# Patient Record
Sex: Male | Born: 1965 | Race: White | Hispanic: No | Marital: Single | Smoking: Never smoker
Health system: Southern US, Community
[De-identification: ages and names within clinical notes are randomized; demographics above are authoritative.]

## PROBLEM LIST (undated history)

## (undated) DIAGNOSIS — R519 Headache, unspecified: Secondary | ICD-10-CM

## (undated) DIAGNOSIS — H539 Unspecified visual disturbance: Secondary | ICD-10-CM

## (undated) DIAGNOSIS — K589 Irritable bowel syndrome without diarrhea: Secondary | ICD-10-CM

## (undated) DIAGNOSIS — M545 Low back pain, unspecified: Secondary | ICD-10-CM

## (undated) DIAGNOSIS — R110 Nausea: Secondary | ICD-10-CM

## (undated) DIAGNOSIS — K219 Gastro-esophageal reflux disease without esophagitis: Secondary | ICD-10-CM

## (undated) HISTORY — DX: Nausea: R11.0

## (undated) HISTORY — DX: Headache, unspecified: R51.9

## (undated) HISTORY — DX: Irritable bowel syndrome without diarrhea: K58.9

## (undated) HISTORY — DX: Low back pain, unspecified: M54.50

## (undated) HISTORY — DX: Unspecified visual disturbance: H53.9

## (undated) HISTORY — DX: Gastro-esophageal reflux disease without esophagitis: K21.9

---

## 2016-11-19 NOTE — Pre-Procedure Instructions (Signed)
Instructions on Hibiclens and medication emailed to PT at [email or web address]

## 2016-11-22 ENCOUNTER — Ambulatory Visit: Payer: BLUE CROSS/BLUE SHIELD | Admitting: Critical Care Medicine

## 2016-11-22 ENCOUNTER — Ambulatory Visit
Admission: RE | Admit: 2016-11-22 | Discharge: 2016-11-22 | Disposition: A | Discharge status: Home / Self Care | Payer: BLUE CROSS/BLUE SHIELD | Source: Ambulatory Visit | Attending: Surgery | Admitting: Surgery

## 2016-11-22 ENCOUNTER — Ambulatory Visit: Payer: BLUE CROSS/BLUE SHIELD | Admitting: Surgery

## 2016-11-22 ENCOUNTER — Encounter
Admission: RE | Disposition: A | Discharge status: Home / Self Care | Payer: Self-pay | Source: Ambulatory Visit | Attending: Surgery

## 2016-11-22 ENCOUNTER — Ambulatory Visit: Payer: Self-pay

## 2016-11-22 DIAGNOSIS — R2232 Localized swelling, mass and lump, left upper limb: Secondary | ICD-10-CM

## 2016-11-22 DIAGNOSIS — D1779 Benign lipomatous neoplasm of other sites: Secondary | ICD-10-CM | POA: Insufficient documentation

## 2016-11-22 SURGERY — EXCISION, LESION
Anesthesia: Anesthesia General | Site: Arm Upper | Laterality: Left | Wound class: Clean

## 2016-11-22 MED ORDER — METOCLOPRAMIDE HCL 5 MG/ML IJ SOLN
10.0000 mg | Freq: Once | INTRAMUSCULAR | Status: DC | PRN
Start: 2016-11-22 — End: 2016-11-22

## 2016-11-22 MED ORDER — BUPIVACAINE HCL (PF) 0.5 % IJ SOLN
INTRAMUSCULAR | Status: AC
Start: 2016-11-22 — End: ?
  Filled 2016-11-22: qty 30

## 2016-11-22 MED ORDER — LACTATED RINGERS IV SOLN
INTRAVENOUS | Status: DC | PRN
Start: 2016-11-22 — End: 2016-11-22

## 2016-11-22 MED ORDER — ACETAMINOPHEN 325 MG PO TABS
650.0000 mg | ORAL_TABLET | Freq: Once | ORAL | Status: DC | PRN
Start: 2016-11-22 — End: 2016-11-22

## 2016-11-22 MED ORDER — PROPOFOL 10 MG/ML IV EMUL (WRAP)
INTRAVENOUS | Status: AC
Start: 2016-11-22 — End: ?
  Filled 2016-11-22: qty 20

## 2016-11-22 MED ORDER — FENTANYL CITRATE (PF) 50 MCG/ML IJ SOLN (WRAP)
INTRAMUSCULAR | Status: DC | PRN
Start: 2016-11-22 — End: 2016-11-22
  Administered 2016-11-22 (×2): 25 ug via INTRAVENOUS

## 2016-11-22 MED ORDER — LIDOCAINE HCL (PF) 2 % IJ SOLN
INTRAMUSCULAR | Status: AC
Start: 2016-11-22 — End: ?
  Filled 2016-11-22: qty 5

## 2016-11-22 MED ORDER — MIDAZOLAM HCL 2 MG/2ML IJ SOLN
INTRAMUSCULAR | Status: DC | PRN
Start: 2016-11-22 — End: 2016-11-22
  Administered 2016-11-22: 2 mg via INTRAVENOUS

## 2016-11-22 MED ORDER — CEFAZOLIN SODIUM 1 G IJ SOLR
INTRAMUSCULAR | Status: AC
Start: 2016-11-22 — End: ?
  Filled 2016-11-22: qty 2000

## 2016-11-22 MED ORDER — HYDROMORPHONE HCL 2 MG PO TABS
2.0000 mg | ORAL_TABLET | Freq: Once | ORAL | Status: DC | PRN
Start: 2016-11-22 — End: 2016-11-22

## 2016-11-22 MED ORDER — FENTANYL CITRATE (PF) 50 MCG/ML IJ SOLN (WRAP)
25.0000 ug | INTRAMUSCULAR | Status: DC | PRN
Start: 2016-11-22 — End: 2016-11-22

## 2016-11-22 MED ORDER — LIDOCAINE HCL 2 % IJ SOLN
INTRAMUSCULAR | Status: DC | PRN
Start: 2016-11-22 — End: 2016-11-22
  Administered 2016-11-22: 100 mg

## 2016-11-22 MED ORDER — ACETAMINOPHEN 325 MG PO TABS
650.0000 mg | ORAL_TABLET | Freq: Three times a day (TID) | ORAL | 0 refills | Status: AC
Start: 2016-11-22 — End: 2016-11-27

## 2016-11-22 MED ORDER — FENTANYL CITRATE (PF) 50 MCG/ML IJ SOLN (WRAP)
INTRAMUSCULAR | Status: AC
Start: 2016-11-22 — End: ?
  Filled 2016-11-22: qty 2

## 2016-11-22 MED ORDER — PROPOFOL INFUSION 10 MG/ML
INTRAVENOUS | Status: DC | PRN
Start: 2016-11-22 — End: 2016-11-22
  Administered 2016-11-22: 200 mg via INTRAVENOUS

## 2016-11-22 MED ORDER — LIDOCAINE HCL 1 % IJ SOLN
INTRAMUSCULAR | Status: AC
Start: 2016-11-22 — End: ?
  Filled 2016-11-22: qty 20

## 2016-11-22 MED ORDER — MIDAZOLAM HCL 2 MG/2ML IJ SOLN
INTRAMUSCULAR | Status: AC
Start: 2016-11-22 — End: ?
  Filled 2016-11-22: qty 2

## 2016-11-22 MED ORDER — CEFAZOLIN SODIUM 1 G IJ SOLR
2.0000 g | INTRAMUSCULAR | Status: AC
Start: 2016-11-22 — End: 2016-11-22
  Administered 2016-11-22: 10:00:00 2 g via INTRAVENOUS

## 2016-11-22 MED ORDER — BUPIVACAINE-EPINEPHRINE (PF) 0.5% -1:200000 IJ SOLN
INTRAMUSCULAR | Status: DC | PRN
Start: 2016-11-22 — End: 2016-11-22
  Administered 2016-11-22: 30 mL

## 2016-11-22 MED ORDER — ONDANSETRON HCL 4 MG/2ML IJ SOLN
INTRAMUSCULAR | Status: DC | PRN
Start: 2016-11-22 — End: 2016-11-22
  Administered 2016-11-22: 4 mg via INTRAVENOUS

## 2016-11-22 MED ORDER — ONDANSETRON HCL 4 MG/2ML IJ SOLN
INTRAMUSCULAR | Status: AC
Start: 2016-11-22 — End: ?
  Filled 2016-11-22: qty 2

## 2016-11-22 MED ORDER — ONDANSETRON HCL 4 MG/2ML IJ SOLN
4.0000 mg | Freq: Once | INTRAMUSCULAR | Status: DC | PRN
Start: 2016-11-22 — End: 2016-11-22

## 2016-11-22 MED ORDER — HYDROMORPHONE HCL 1 MG/ML IJ SOLN
0.5000 mg | INTRAMUSCULAR | Status: DC | PRN
Start: 2016-11-22 — End: 2016-11-22

## 2016-11-22 MED ORDER — CEFAZOLIN SODIUM 1 G IJ SOLR
2.0000 g | Freq: Once | INTRAMUSCULAR | Status: DC
Start: 2016-11-22 — End: 2016-11-22

## 2016-11-22 MED ORDER — BUPIVACAINE-EPINEPHRINE (PF) 0.5% -1:200000 IJ SOLN
INTRAMUSCULAR | Status: AC
Start: 2016-11-22 — End: ?
  Filled 2016-11-22: qty 30

## 2016-11-22 SURGICAL SUPPLY — 46 items
BLADE SS SURGICAL 15 (Blade) ×4 IMPLANT
DRAPE SRG TBRN CNVRT 121.5X106X77IN LF (Drape)
DRAPE SURGICAL ABDOMINAL POLY (Drape)
DRAPE SURGICAL ABDOMINAL POLY LAPAROSCOPIC (Drape) IMPLANT
DRAPE SURGICAL REINFORCE FENESTRATE L121.5 IN X W106 IN X H77 IN (Drape) IMPLANT
DRAPE THYROID WITH ARMBOARD (Drape)
DRAPE TIBURON ABD LAP 14X11IN (Drape)
DRESSING PETRO 3% BI 3BRM GZE XR 9X5IN (Dressing) ×1
DRESSING PETROLATUM XEROFORM L9 IN X W5 IN 3% BISMUTH TRIBROMOPHENATE (Dressing) IMPLANT
DRESSING SECUREMENT TEGADERM L4 1/2 IN X (Dressing)
DRESSING SECUREMENT TEGADERM L4 1/2 IN X W3 1/2 IN INTRAVENOUS FILM (Dressing) IMPLANT
DRESSING TEGADERM 3.5X4.5IN (Dressing)
DRESSING TEGADERM 4X4X3/4IN (Dressing) ×1
DRESSING TRANSPARENT L4 3/4 IN X W4 IN (Dressing) ×1
DRESSING TRANSPARENT L4 3/4 IN X W4 IN POLYURETHANE ADHESIVE (Dressing) IMPLANT
DRESSING XEROFORM 5X9IN (Dressing) ×1
ELECTRODE ELECTROSURGICAL BLADE PENCIL L10 FT OD3/8 IN PLUMEPEN ELITE (Cautery) IMPLANT
ELECTRODE ESURG BLDE PNCL PLUMEPEN ELT (Cautery) ×1
GAUZE SPONGE 4X4 NS (Dressing) ×1
GLOVE SURG BIOGEL SZ7.5 (Glove) ×4 IMPLANT
GOWN SMART IMPERVIOUS LARGE (Gown) ×6 IMPLANT
MASTISOL VIAL 2/3CC STRL (Skin Closure) ×1 IMPLANT
PACK MINOR (Pack) ×2 IMPLANT
PENCIL PLUMEPEN ELITE 10FT (Cautery) ×1
SPONGE CHLRPRP TINT 26ML (Applicator) ×2 IMPLANT
SPONGE GAUZE L4 IN X W4 IN 16 PLY (Dressing) ×1
SPONGE GAUZE L4 IN X W4 IN 16 PLY MAXIMUM ABSORBENT USP TYPE VII (Dressing) IMPLANT
SUTURE COATED VICRYL 3-0 L18 IN BRAID (Suture)
SUTURE COATED VICRYL 3-0 L18 IN BRAID TIES 12 STRAND COATED UNDYED (Suture) IMPLANT
SUTURE COATED VICRYL 3-0 SH L27 IN BRAID (Suture) ×1
SUTURE COATED VICRYL 3-0 SH L27 IN BRAID COATED VIOLET ABSORBABLE (Suture) ×1 IMPLANT
SUTURE MONOCRYL 4-0 PS2 27IN (Suture) ×2 IMPLANT
SUTURE VICRYL 3-0 12X18IN (Suture)
SUTURE VICRYL 3-0 SH 27IN (Suture) ×1
SYRINGE IRR BULB 60ML (Syringes, Needles) ×1
SYRINGE MEDLINE 60 ML LID SOFT BULB TIP (Syringes, Needles) ×1
SYRINGE MEDLINE 60 ML LID SOFT BULB TIP IRRIGATION TYVEK (Syringes, Needles) ×1 IMPLANT
TIP SUCTION MEDLINE STANDARD PLASTIC (Suction) ×1
TIP SUCTION STANDARD PLASTIC TUBE FLEXIBLE TRANSPARENT SLIP RESISTANT (Suction) ×1 IMPLANT
TOWEL L26 IN X W17 IN COTTON PREWASH (Procedure Accessories) ×1
TOWEL L26 IN X W17 IN COTTON PREWASH DELINT BLUE ACTISORB SURGICAL (Procedure Accessories) ×1 IMPLANT
TOWEL OR STRL BLUE 17X26IN (Procedure Accessories) ×1
TUBE SUCT 7MMX12 STERILE (Suction) ×1
TUBING SUCTION ID9/32 IN L12 FT (Suction) ×1
TUBING SUCTION ID9/32 IN L12 FT NONCONDUCTIVE MALE TO MALE CONNECTOR (Suction) ×1 IMPLANT
YANKAUER BULB TIP W/O VNT FLX (Suction) ×1

## 2016-11-22 NOTE — Progress Notes (Signed)
Pt in PACU arousable now, VSS, still unable to assess pain and will continue assess. Dressing to left posterior armpit/shoulder area CDI, ice to site, IV patent and will continue to assess pt.

## 2016-11-22 NOTE — Discharge Instr - AVS First Page (Signed)
Admit Date: 11/22/2016   Discharge Date: 11/22/2016     Attending: Cordelia Pen, MD   Surgeon: Surgeon(s):  Cordelia Pen, MD  Thomasena Edis, MD      Procedures:  Procedure(s) (LRB):  EXCISION, LESION BENIGN LEFT ARM (Left)      DISCHARGE INSTRUCTIONS--SKIN/SUBCUTANEOUS TISSUE SURGERY    Patient Instructions after your outpatient surgery.  Please refer to the complete postop instructions for details.    WHAT TO EXPECT AT YOUR SURGICAL SITE(S):     Pink/reddish drainage, bruising, swelling/lumps at incision(s) may occur and is normal.    CARE FOR THE INCISION(S):     Skin glue over your incision(s) will dissolve over the next two weeks.   If you have white tapes on the skin (steri-strips), these will fall off over 7-10 days   Shower 24 hours after your surgery.  No tub bathing, or pool/ocean for 1 week.   Use an ice pack every 15-20 min intermittently for the first 48 hrs as needed.   If you have a surgical drain, refer to the attached drain care instructions or to our website.    DIET:     Keep up with your liquids.   If your appetite has returned, eat what your system can tolerate.     ACTIVITY:     If it hurts, don't do it!   Walking & stairs are encouraged.     Avoid strenuous physical activity & lifting until cleared by your surgeon.   You may drive again once you have stopped taking prescription medication for pain.      MEDICATION:     Resume all home medications.   Use over-the-counter pain medications or a prescribed narcotic for your discomfort as needed.     For constipation, an over-the-counter stool softener or laxative may be taken.  Please refer to attached detailed instructions or our website for this issue.    WHAT TO LOOK FOR:    Please call our office immediately at [phone] or go to the ER if you develop any of the following:     Excessive drainage, bleeding, redness or swelling at or around the incision(s)   Fever over 101F   Increased incisional pain   Persistent  nausea or vomiting   Difficulty with urination   Difficulty breathing, chest pain or calf pain    FOLLOW-UP:     We will see you in our office 1-2 weeks after your surgery.   If not already scheduled, please call [phone] to arrange your post op visit.

## 2016-11-22 NOTE — Progress Notes (Signed)
Pt in phase 2 a/a/ox4, VSS, minimal pain upon movement,  Dressing CDI, IV removed and instructions given to both Pt and girlfriend Elon Jester with verbalized understanding, all questions answered, sent home with instructions, belongings and dressings.  Wheeled to car with assistance and discharged home in stable condition.

## 2016-11-22 NOTE — Discharge Instructions (Signed)

## 2016-11-22 NOTE — Anesthesia Postprocedure Evaluation (Signed)
Anesthesia Post Evaluation    Patient: Louis Carter    Procedures performed: Procedure(s):  EXCISION, LESION BENIGN LEFT ARM    Anesthesia type: General ETT    Patient location:PACU    Last vitals:   Vitals:    11/22/16 1115   BP: 118/70   Pulse: 68   Resp: 16   Temp:    SpO2: 100%       Post pain: Patient not complaining of pain, continue current therapy      Mental Status:awake    Respiratory Function: tolerating room air    Cardiovascular: stable    Nausea/Vomiting: patient not complaining of nausea or vomiting    Hydration Status: adequate    Post assessment: no apparent anesthetic complications    Signed by: Harriet Butte, 11/22/2016 1:24 PM

## 2016-11-22 NOTE — Interval H&P Note (Signed)
I have reviewed the H&P, examined the patient and there are no changes.    The risks and benefits of the procedures and alternatives were explained to the patient and they agreed. Patient was given an opportunity to ask questions and all questions were answered to their satisfaction. Informed consent was given for the procedure.    Milbern Doescher, MD  [facility]  Office: [phone]

## 2016-11-22 NOTE — Anesthesia Preprocedure Evaluation (Signed)
Anesthesia Evaluation    AIRWAY    Mallampati: II    TM distance: >3 FB  Neck ROM: full  Mouth Opening:full   CARDIOVASCULAR    cardiovascular exam normal       DENTAL    no notable dental hx     PULMONARY    pulmonary exam normal     OTHER FINDINGS              Relevant Problems   No active problems are marked relevant to this note.               Anesthesia Plan    ASA 2     general                     intravenous induction   Detailed anesthesia plan: general LMA        Post op pain management: per surgeon and PO analgesics    informed consent obtained      pertinent labs reviewed             Signed by: Lamonte Richer Meridith Romick 11/22/16 8:09 AM

## 2016-11-22 NOTE — Op Note (Signed)
OPERATIVE NOTE    Date Time: 11/22/16 10:15 AM  Date of Operation: 11/22/2016    Providers Performing:   Surgeon(s) and Role:     * Cordelia Pen, MD - Primary     * Celedonio Sortino, Dillon Bjork, MD - Resident - Assisting    Surgeon(s):  Cordelia Pen, MD  Thomasena Edis, MD      Operative Procedure:   Procedure(s):  EXCISION, LESION BENIGN LEFT ARM    Indication:   Pre-Op Diagnosis Codes:     * Localized swelling, mass and lump, left upper limb [R22.32]    Details of Operation:   After informed consent was obtained, anesthesia was induced and the patient was  prepped and drapped in sterile fashion. A 4cm incision was made with a #15 blade and taken down to the lipoma capsule using electrocautery. A mass consistent with lipomatous tissue was extruded with pressure as well as blunt dissection and electrocautery. The mass was removed in its entirety and the wound was irrigated and injected with 0.5 % marcaine. The wound was closed with 2-0 vicryl interrupted sutures and the skin was closed with 2-0 nylon vertical mattress stitches. The patient tolerated the procedure well and was transferred to PACU in stable condition    Fluids (I/O):   EBL: minimal  UOP: NR  Crystalloid:  Colloid: none  Blood Products: none    Implants:   * No implants in log *    Drains:   none    Specimens:        SPECIMENS (last 24 hours)      Pathology Specimens     Row Name 11/22/16 0900                Specimen Information    Specimen Testing Required Routine Pathology       Specimen ID  [id number]       Specimen Description Left upper arm lipoma             Wound Class:   Clean    Signed by:  Elfredia Nevins, MD   PGY-4, General Surgery

## 2016-11-22 NOTE — Transfer of Care (Signed)
Anesthesia Transfer of Care Note    Patient: Louis Carter    Procedures performed: Procedure(s):  EXCISION, LESION BENIGN LEFT ARM    Anesthesia type: General LMA    Patient location:Phase I PACU    Last vitals:   Vitals:    11/22/16 1023   BP: 114/59   Pulse: 77   Resp: 16   Temp: 36.9 C (98.5 F)   SpO2: 99%       Post pain: Patient not complaining of pain, continue current therapy      Mental Status:awake    Respiratory Function: tolerating nasal cannula    Cardiovascular: stable    Nausea/Vomiting: patient not complaining of nausea or vomiting    Hydration Status: adequate    Post assessment: no apparent anesthetic complications    Signed by: Lorelee New  11/22/16 10:24 AM

## 2016-11-24 LAB — LAB USE ONLY - HISTORICAL SURGICAL PATHOLOGY

## 2016-11-26 ENCOUNTER — Encounter: Payer: Self-pay | Admitting: Surgery

## 2018-10-25 ENCOUNTER — Other Ambulatory Visit: Payer: Self-pay | Admitting: Surgical

## 2018-10-27 ENCOUNTER — Other Ambulatory Visit: Payer: Self-pay | Admitting: Surgical

## 2022-12-08 IMAGING — MR MRI LUMBAR SPINE WITHOUT CONTRAST
6 of 8 series · 14 of 48 positions shown · IV contrast (gadolinium)
Comparison: None

________________________________________________________________________________________________ 
MRI LUMBAR SPINE WITHOUT CONTRAST, 12/08/2022 [DATE]: 
CLINICAL INDICATION: Radiculopathy, low back pain extending to left buttock and 
leg
TECHNIQUE: Sagittal T1, Sagittal T2, Sagittal STIR, Axial T1 and Axial T2 MR 
images of the lumbar spine were performed without intravenous gadolinium 
enhancement. 
FINDINGS lumbar vertebral heights are intact. There is moderate to marked disc 
narrowing at L5-S1. Other disc heights are preserved. Modic type I change at 
L5-S1. No evidence for malignancy. Conus terminates T12-L1. 
At L5-S1 there is a large left paracentral disc extrusion measuring 10 x 9 mm, 
deforming the left ventral thecal sac and impinging the left S1 nerve root. 
There has been left laminotomy and ligament resection at this level. Foramina 
are open. Moderate facet degenerative change. 
At L4-5 there is mild disc bulge. There is left posterolateral annular tear and 
mild disc bulge, with mild left foraminal encroachment. There is mild 
encroachment on the L5 nerve roots bilaterally as they enter the lateral 
recesses. Moderate facet change. Right foramen open. 
At L3-4 the canal and foramina are open. 
At L2-3 there is left posterolateral disc bulge with mild effacement of the left 
ventral thecal sac as the left L3 nerve root exits. The canal remains open. 
Foramina are open. 
At L1-2 the canal and foramina are open. 
There is mild thoracolumbar dextro curvature.

[Series 101: survey · axial · 10.0mm · 1.25mm/px · 1 of 10 slices shown]
[im 1/10]
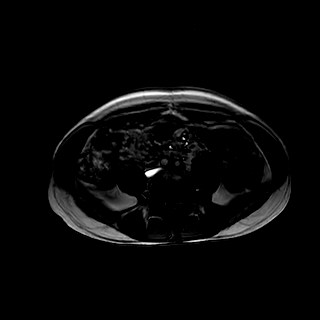

[Series 201: t2w_cor-surv · coronal · 6.0mm · 0.62mm/px · 1 of 10 slices shown]
[im 1/10]
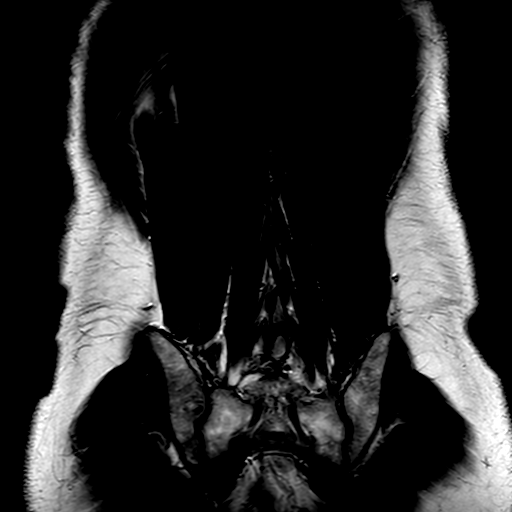

[Series 301: T1 · sagittal · 4.0mm · 0.42mm/px · 3 of 18 slices shown]
[im 1/18]
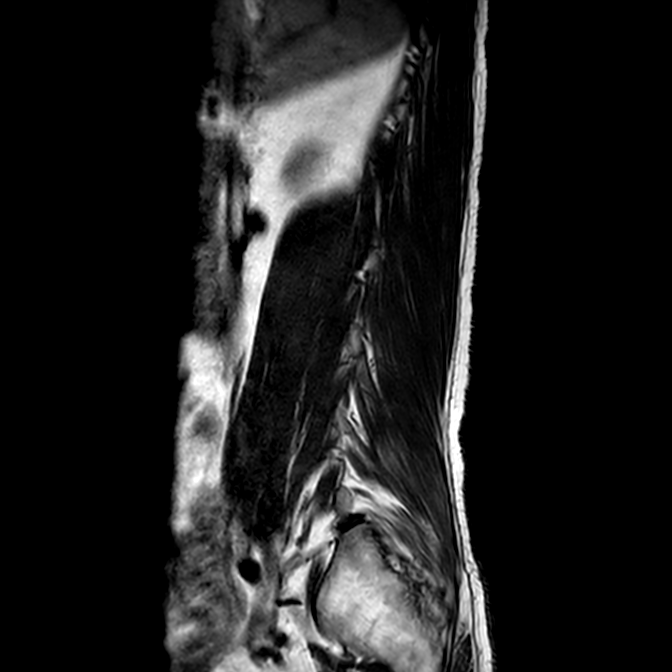
[im 9/18]
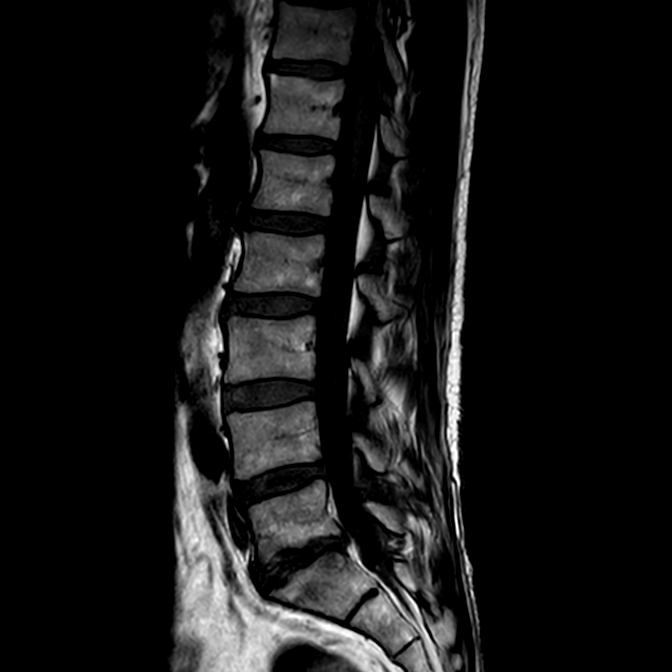
[im 18/18]
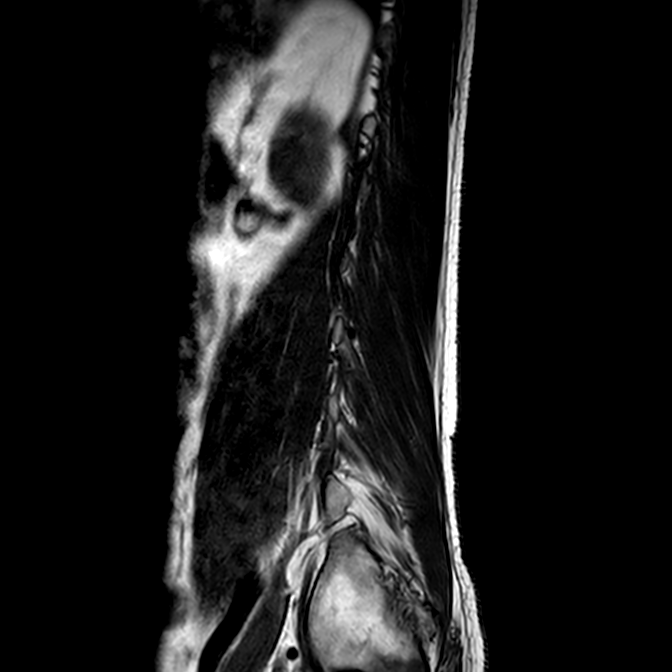

[Series 402: (id)_mdixon_tse · sagittal · 4.0mm · 0.42mm/px · 3 of 18 slices shown]
[im 1/18]
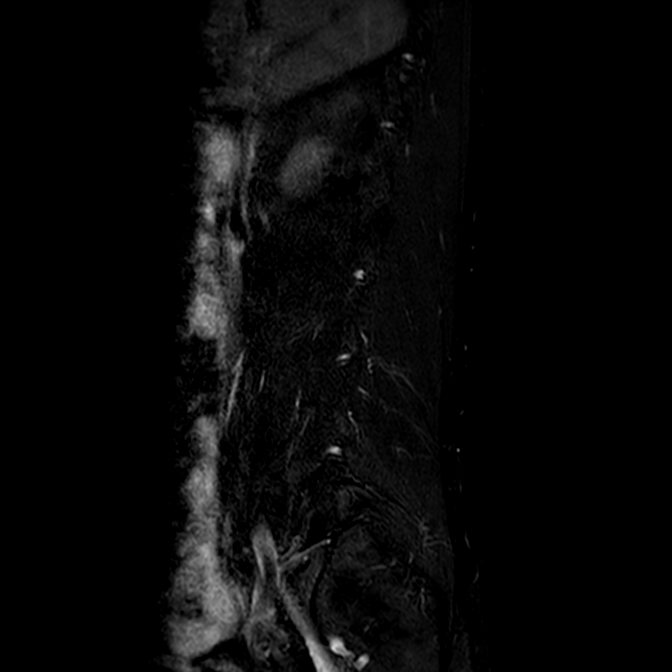
[im 9/18]
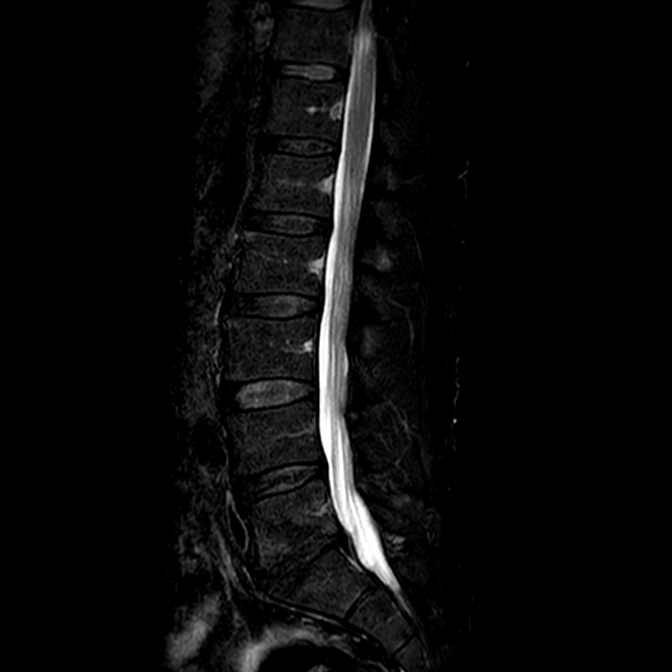
[im 18/18]
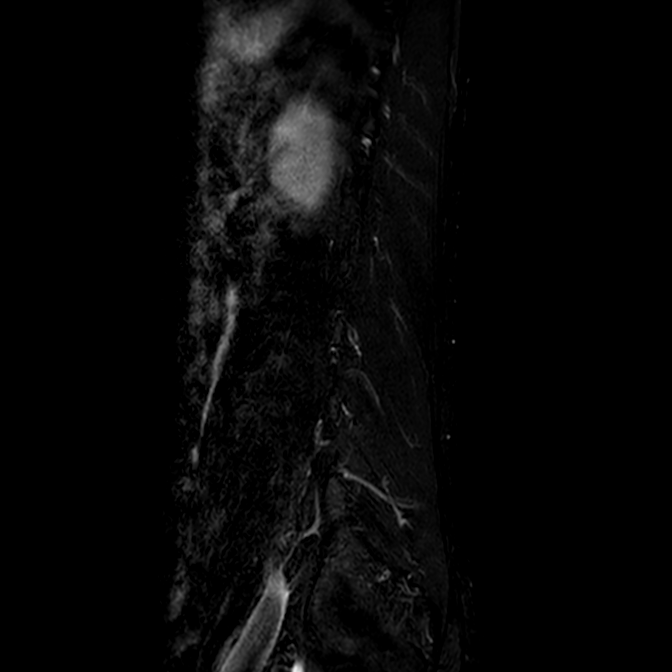

[Series 403: st2w_mdixon_tse · sagittal · 4.0mm · 0.42mm/px · 2 of 18 slices shown]
[im 1/18]
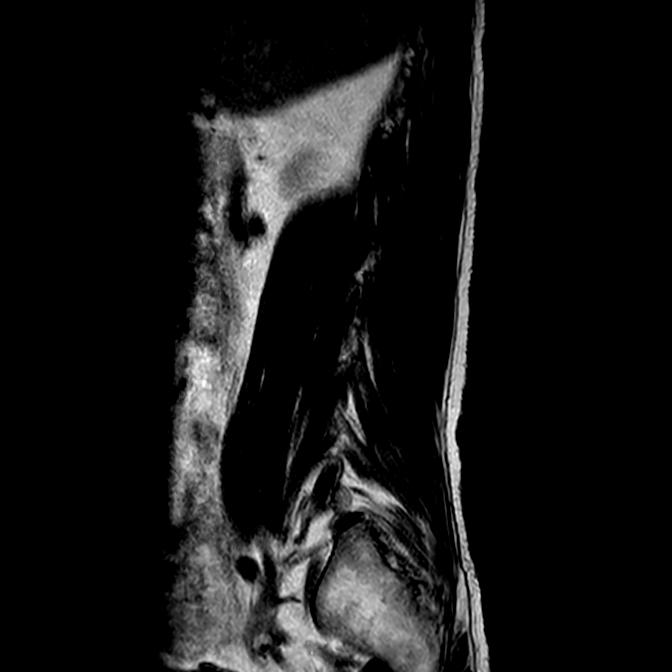
[im 9/18]
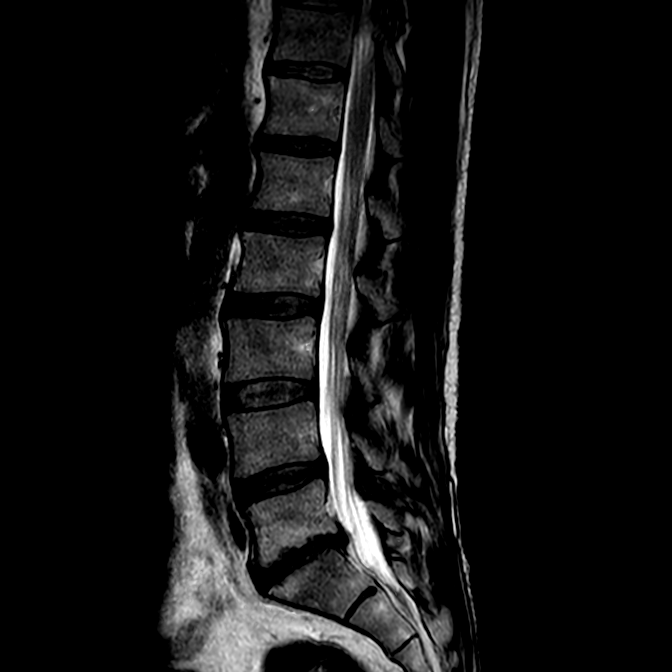

[Series 601: T2 · axial · 4.0mm · 0.30mm/px · z∈[-109,+125]mm · 4 of 30 slices shown]
[im 1/30]
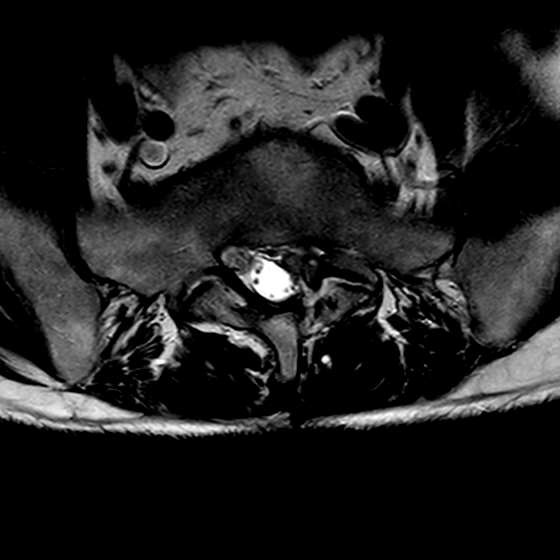
[im 10/30]
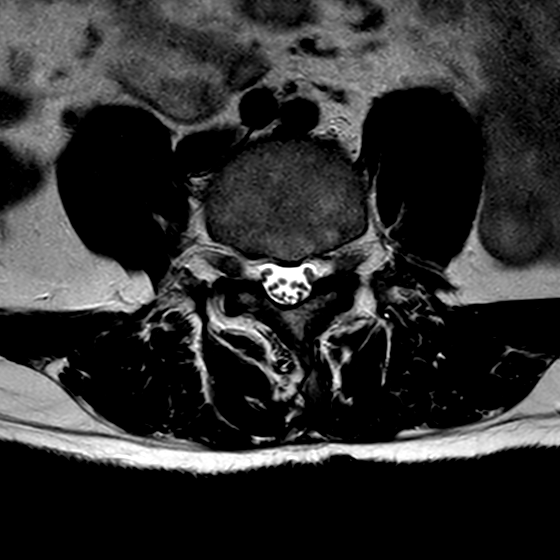
[im 20/30]
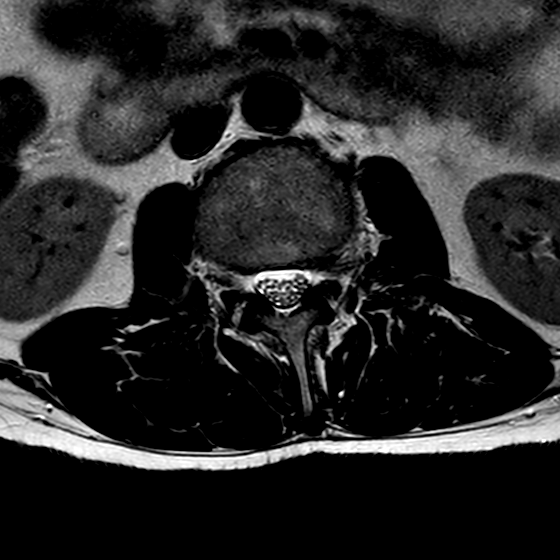
[im 30/30]
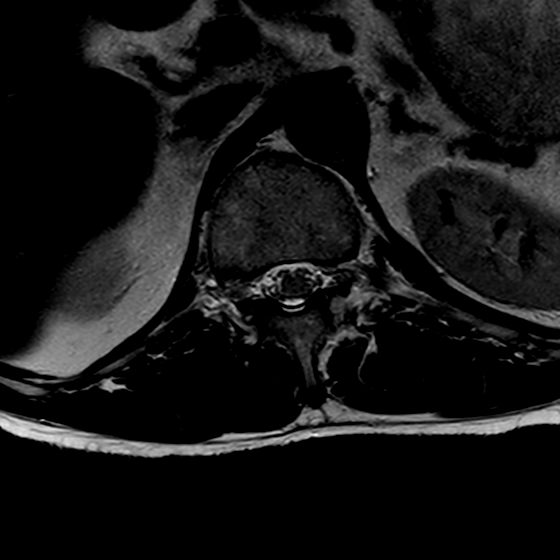

[14 of 48 positions shown; findings below may reference images not displayed]

IMPRESSION: Large left paracentral disc extrusion at L5-S1 impinges the left S1 nerve root. 
There has been left L5 laminotomy with ligament resection. Modic type I changes 
most pronounced on the right at L5-S1. 
Left posterolateral disc bulge with annular tear at L4-5 with mild left 
foraminal encroachment. Mild narrowing of the upper L5 lateral recesses. 
No evidence for spinal malignancy.

## 2023-02-11 IMAGING — MR MRI CERVICAL SPINE WITHOUT CONTRAST
5 of 7 series · 21 of 48 positions shown · IV contrast (gadolinium)
Comparison: None.

________________________________________________________________________________________________ 
******** ADDENDUM #1 ********/n 
Prior examination from November 10, 2021. 
When compared to prior examination, there has been no significant change in 
segmental analysis. 
MRI CERVICAL SPINE WITHOUT CONTRAST, 02/11/2023 [DATE]: 
CLINICAL INDICATION: Headaches with right hand and finger numbness, tingling
TECHNIQUE: Multiplanar, multiecho position MR images of the cervical spine were 
performed without intravenous gadolinium enhancement. Patient was scanned on a 
1.5T magnet.

[Series 101: survey · axial · 10.0mm · 1.25mm/px · z∈[-6,+200]mm · 3 of 10 slices shown]
[im 1/10]
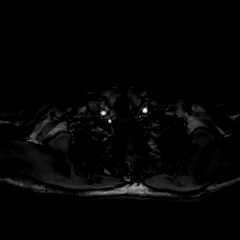
[im 5/10]
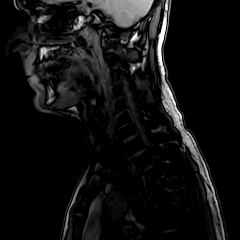
[im 10/10]
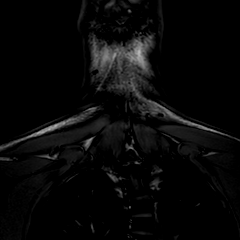

[Series 201: t2w_cor-surv · coronal · 5.0mm · 0.69mm/px · 2 of 7 slices shown]
[im 1/7]
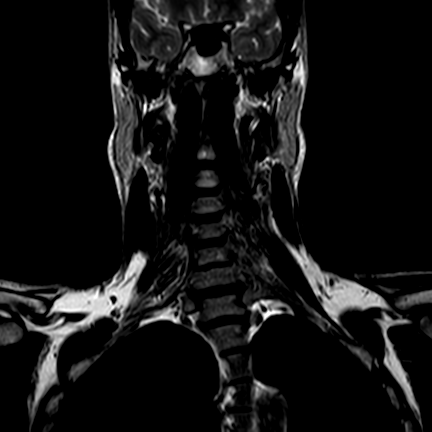
[im 7/7]
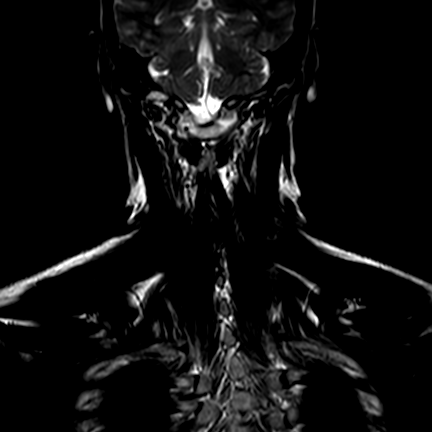

[Series 301: T1 · sagittal · 3.0mm · 0.39mm/px · 5 of 15 slices shown]
[im 1/15]
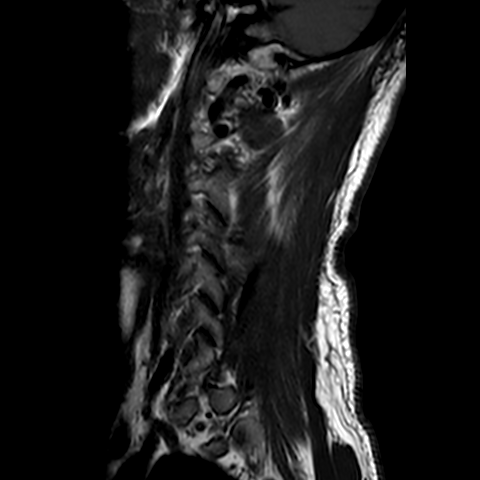
[im 4/15]
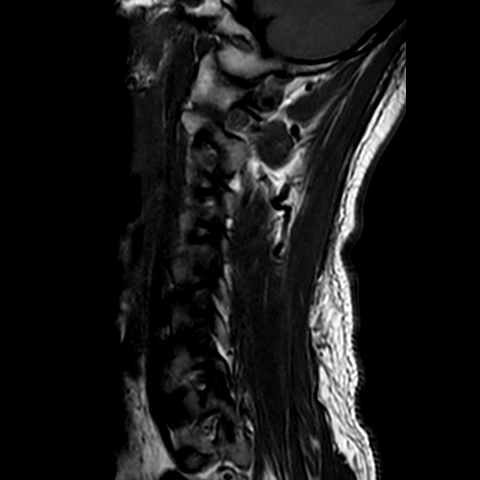
[im 8/15]
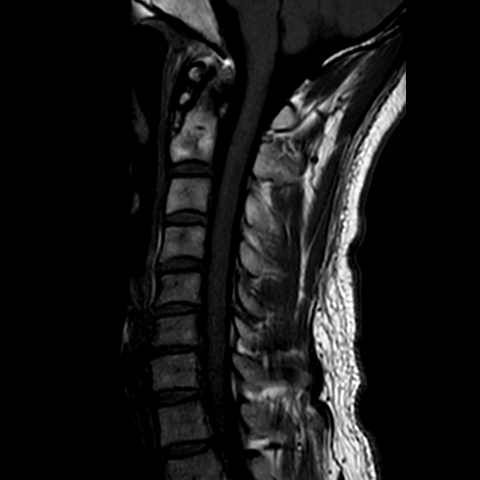
[im 11/15]
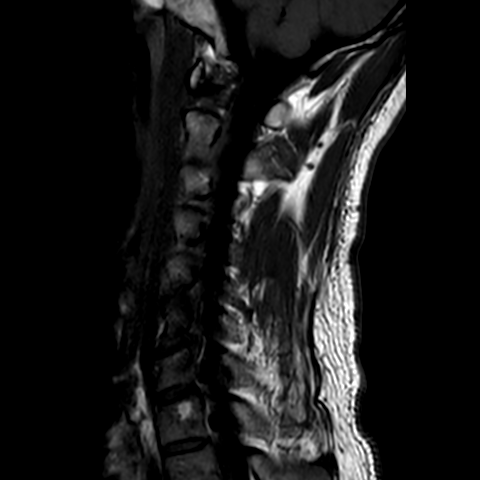
[im 15/15]
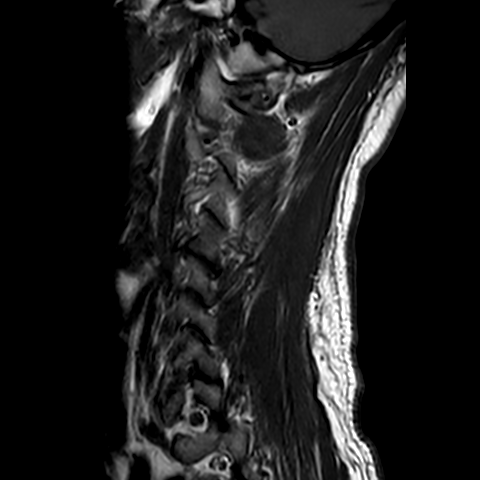

[Series 402: (id)_mdixon_tse · sagittal · 3.0mm · 0.35mm/px · 2 of 15 slices shown]
[im 1/15]
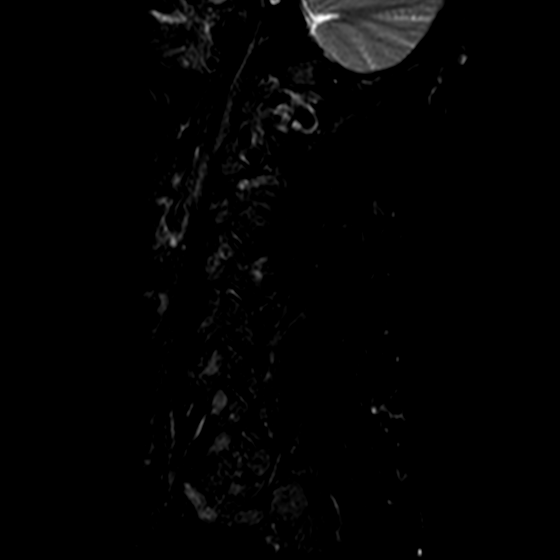
[im 3/15]
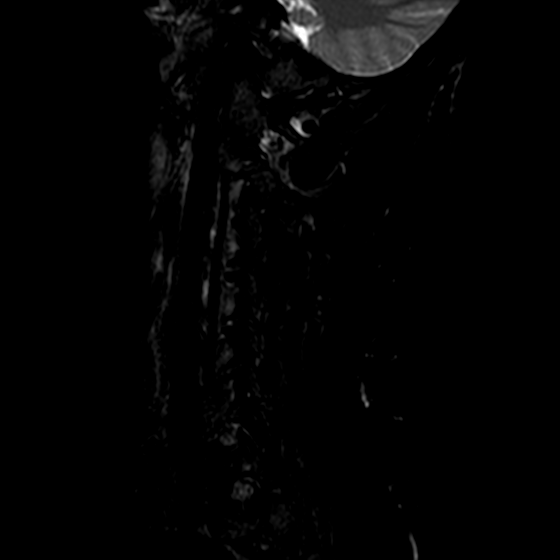

[Series 601: T2 · axial · 3.0mm · 0.31mm/px · z∈[-24,+80]mm · 9 of 34 slices shown]
[im 1/34]
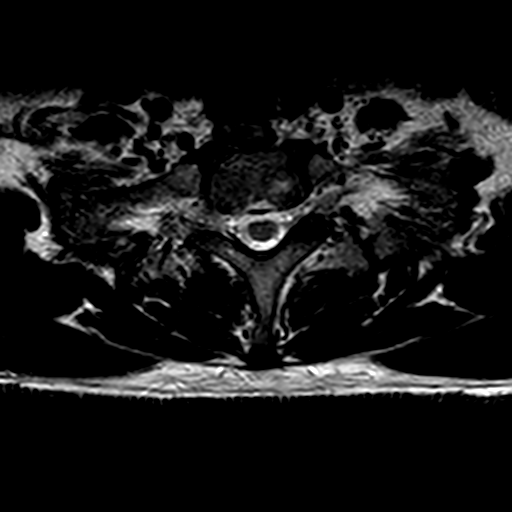
[im 6/34]
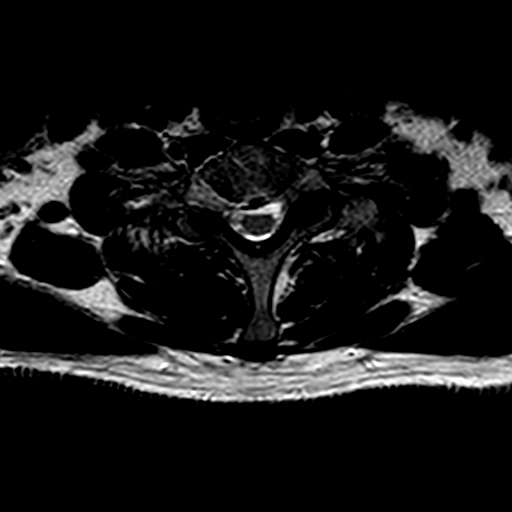
[im 12/34]
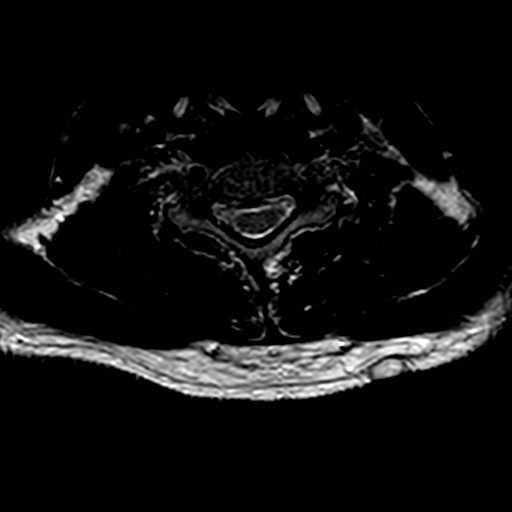
[im 14/34]
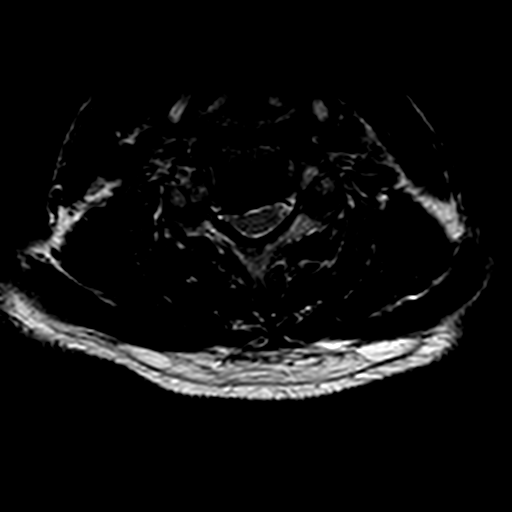
[im 17/34]
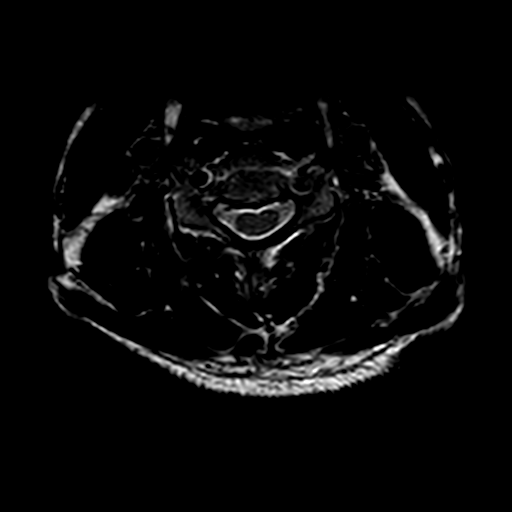
[im 20/34]
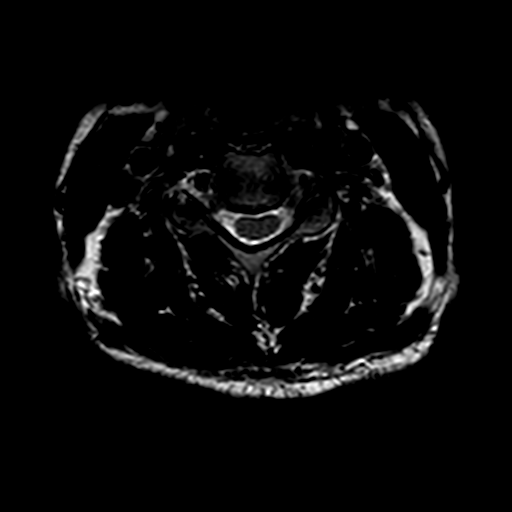
[im 23/34]
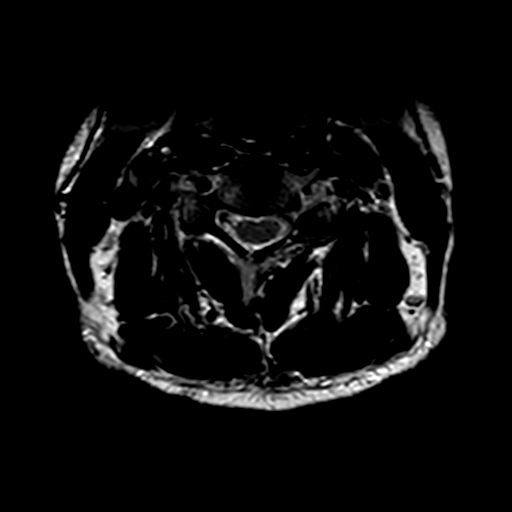
[im 28/34]
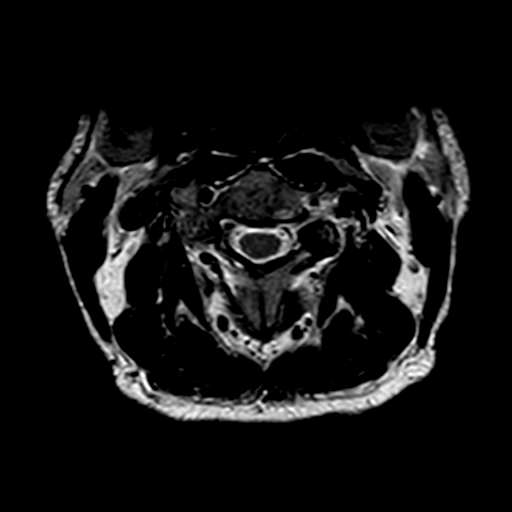
[im 34/34]
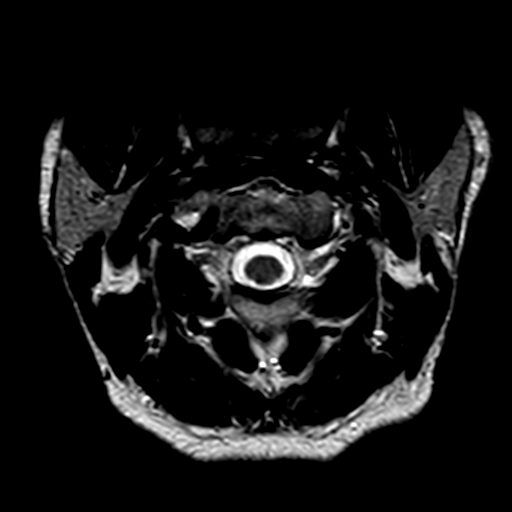

[21 of 48 positions shown; findings below may reference images not displayed]

FINDINGS: -------------------------------------------------------------------------------- 
----------------- 
GENERAL: 
ALIGNMENT: Rightward curvature at the cervicothoracic junction, leftward 
curvature of the upper thoracic spine, very mild retrolisthesis of C4 on C5. 
VERTEBRAL BODY HEIGHT: Normal.  
MARROW SIGNAL: No focal suspect signal abnormality. 
CORD SIGNAL: Normal.  
ADDITIONAL FINDINGS: None. 
-------------------------------------------------------------------------------- 
---------------- 
SEGMENTAL: 
CRANIOCERVICAL JUNCTION: No significant stenosis. 
C2-C3: No significant central canal or neural foraminal narrowing. 
C3-C4: Very mild disc osteophyte complex with left uncovertebral joint 
hypertrophy. No significant central canal or neural foraminal narrowing.  
C4-C5: Disc osteophyte complex eccentric to the right with shallow central disc 
herniation. Deformity of the ventral cord towards the right side with overall 
mild central canal narrowing. No significant neural foraminal narrowing. 
C5-C6: Disc osteophyte complex eccentric to the right side with right 
uncovertebral joint hypertrophy. Left uncovertebral joint hypertrophy. Deformity 
of the right ventral cord with mild central canal narrowing and moderate right 
lateral recess narrowing. Moderate to severe right neural foraminal narrowing. 
Moderate left neural foraminal narrowing. 
C6-C7: Loss of disc height with disc osteophyte complex, left central shallow 
disc herniation. Deformity of the left ventral cord with overall moderate 
central canal narrowing. Severe bilateral neural foraminal narrowing. 
C7-T1: Right central disc herniation. Mild central canal narrowing. Suspected 
minimal deformity of the right ventral cord. Left facet hypertrophy. Mild left 
and no significant right neural foraminal narrowing. 
-------------------------------------------------------------------------------- 
---------------
IMPRESSION: 1.  Discogenic/degenerative changes as above. 
2.  Cord signal abnormality: None. 
3.  Cord deformity: C4-C5, C5-C6, C6-C7, C7-T1 
4.  Worst level(s) of neural foraminal narrowing: C5-C6 (right), C6-C7 
(bilateral)

## 2023-03-08 IMAGING — CT CT CERVICAL SPINE WITHOUT CONTRAST
3 of 4 series · 10 of 33 positions shown, 12 images · non-contrast
Comparison: MRI cervical spine February 15, 2023.

________________________________________________________________________________________________ 
CT CERVICAL SPINE WITHOUT CONTRAST, 03/08/2023 [DATE]: 
CLINICAL INDICATION: Right-sided neck pain with pain in the right shoulder. 
Right arm numbness and tingling. Preoperative planning. Cervicalgia 
. 
A search for DICOM formatted images was conducted for prior CT imaging studies 
completed at a non-affiliated media free facility.
TECHNIQUE: The cervical spine was scanned from the skull base through T1 
vertebra without contrast on a high-resolution CT scanner using dose reduction 
techniques. Routine MPR reconstructions were performed.

[Series 3: axial · axial · 0.29mm/px · z∈[-174,-98]mm · 2 of 114 slices shown, 3 images]
[im 38/114  soft-tissue]
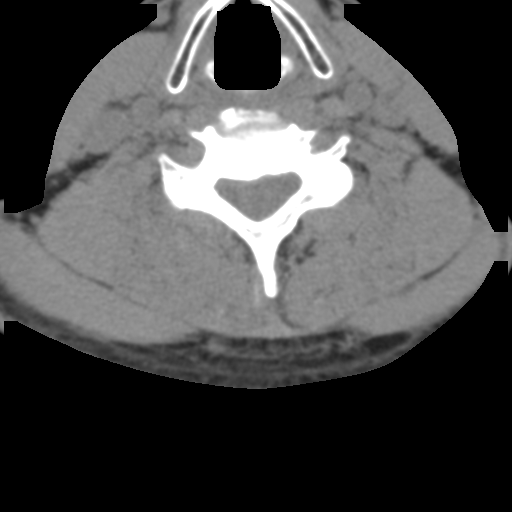
[im 38/114  bone]
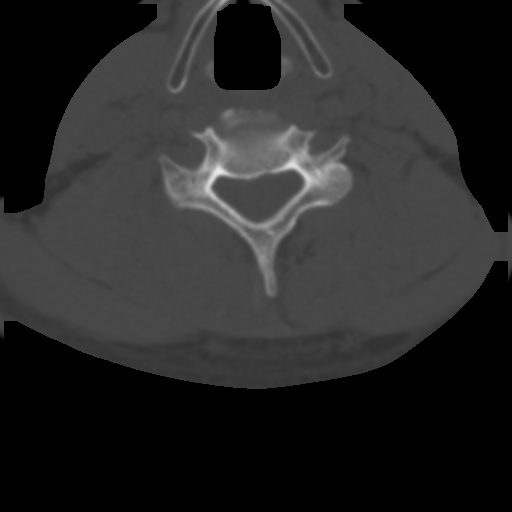
[im 76/114  bone]
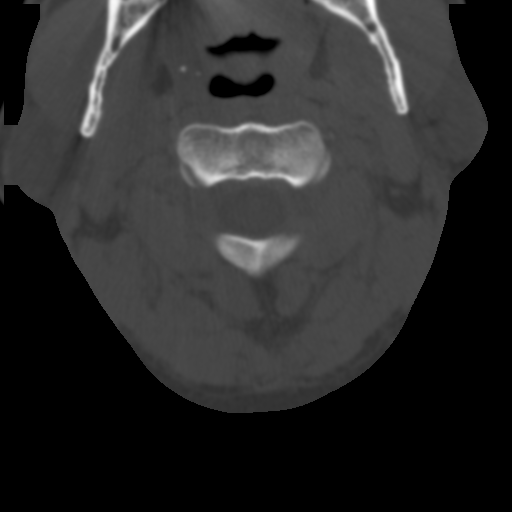

[Series 5: cor · coronal · 0.28mm/px · 3 of 61 slices shown]
[im 13/61  bone]
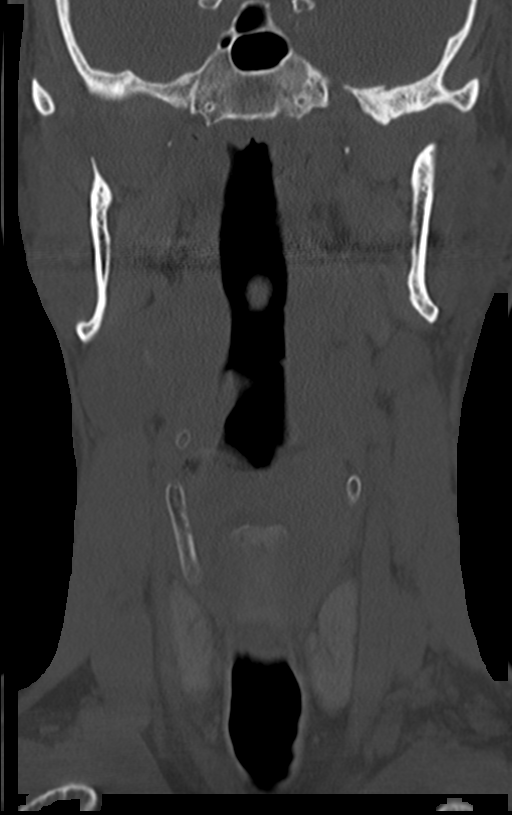
[im 25/61  bone]
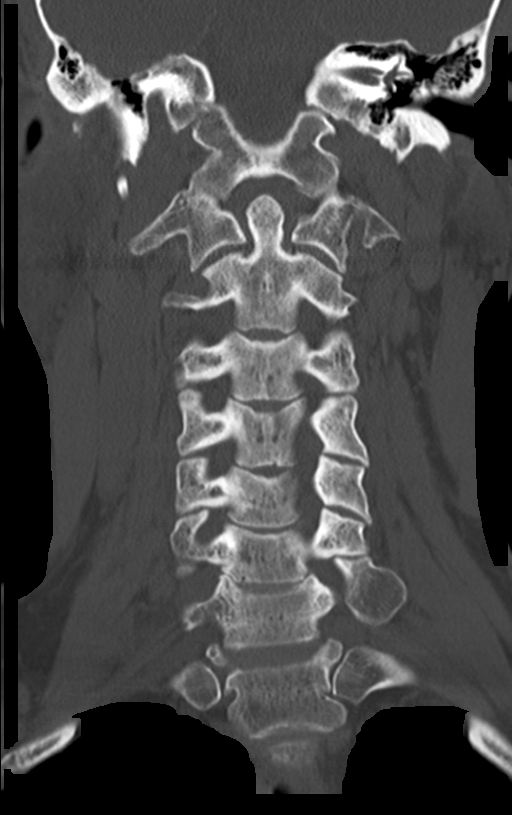
[im 37/61  bone]
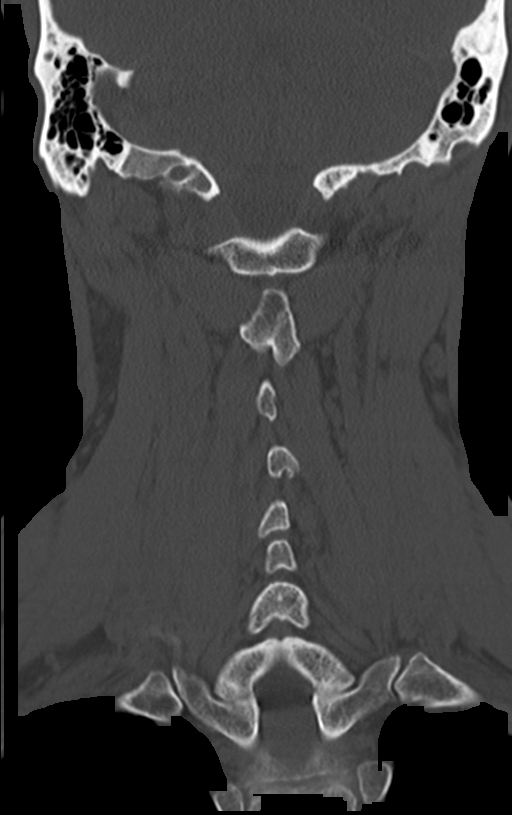

[Series 7: sag st · sagittal · 0.25mm/px · 5 of 75 slices shown, 6 images]
[im 25/75  bone]
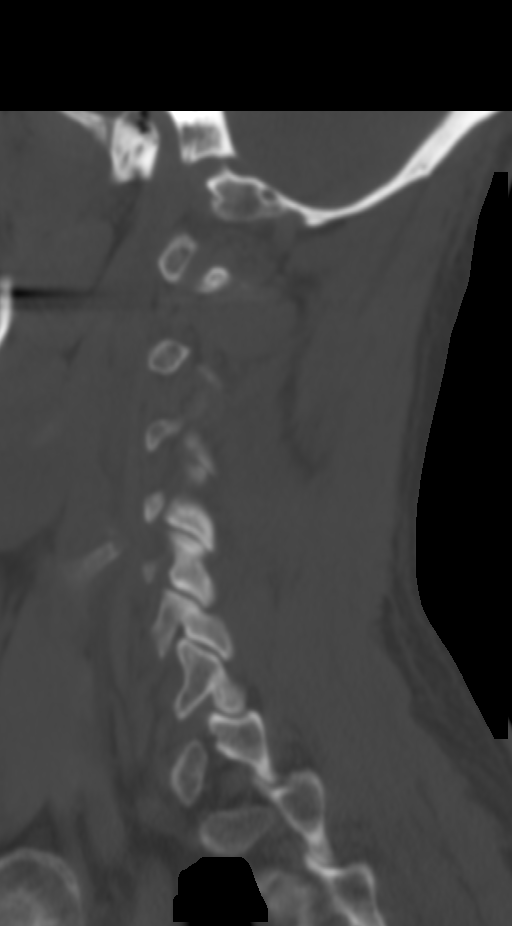
[im 31/75  bone]
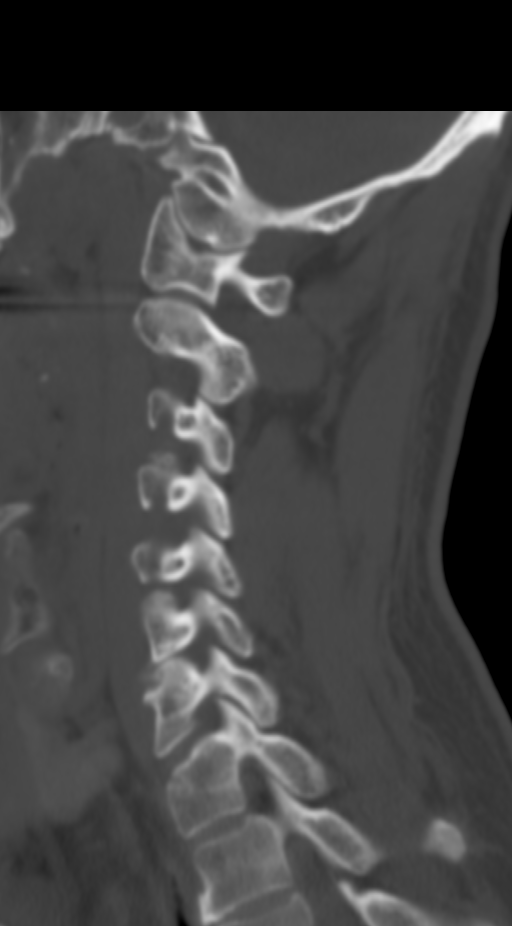
[im 38/75  soft-tissue]
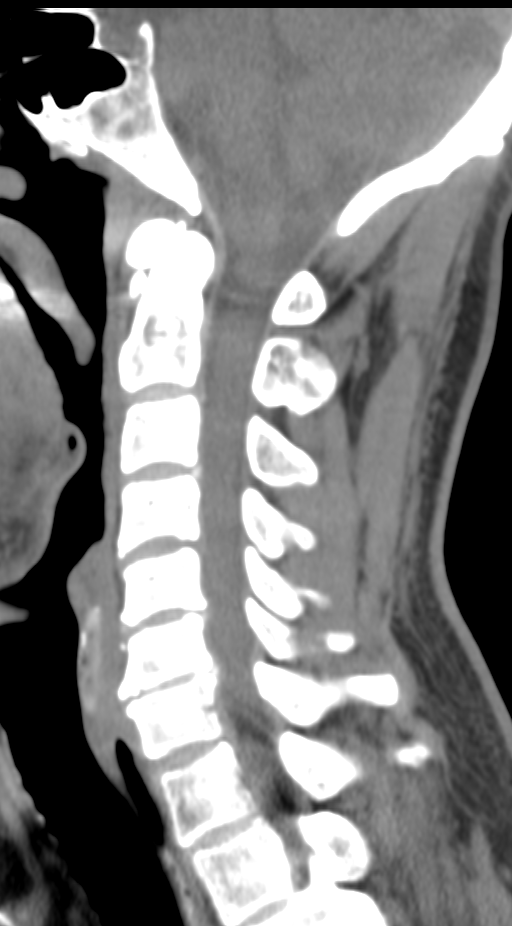
[im 38/75  bone]
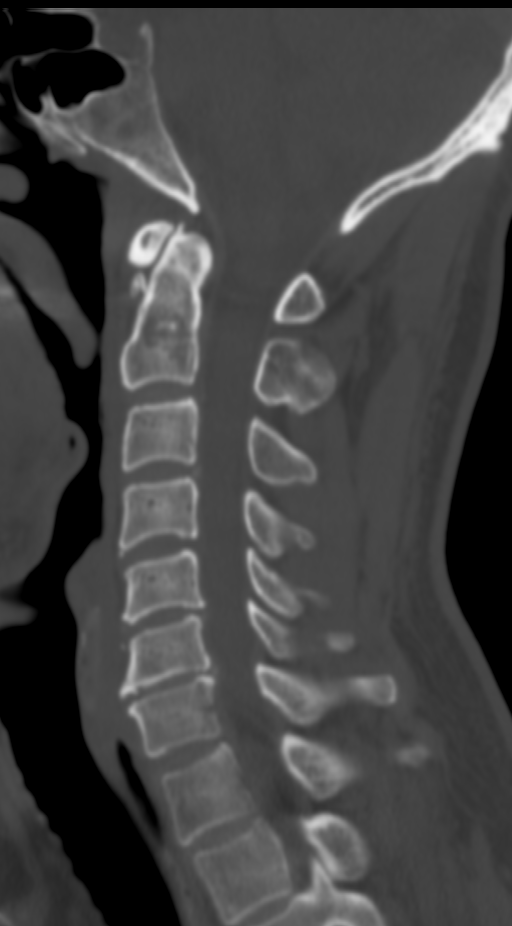
[im 44/75  bone]
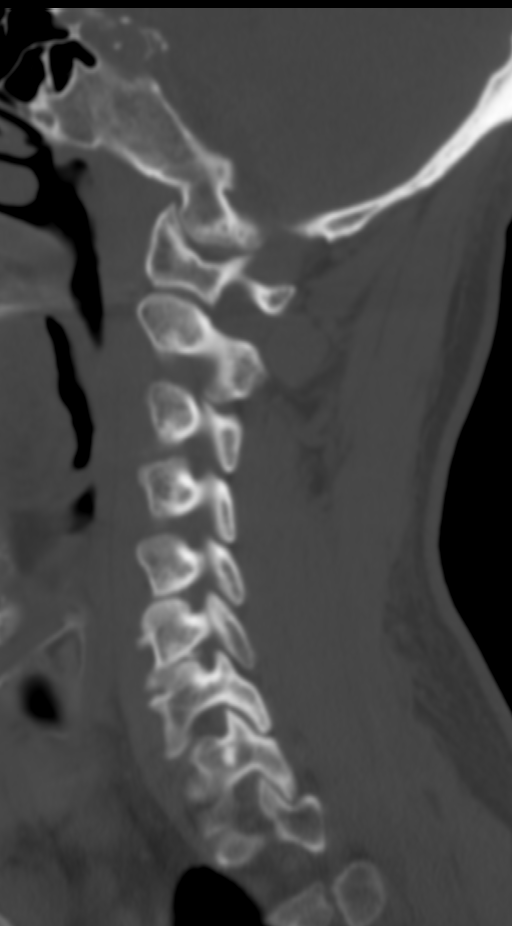
[im 50/75  bone]
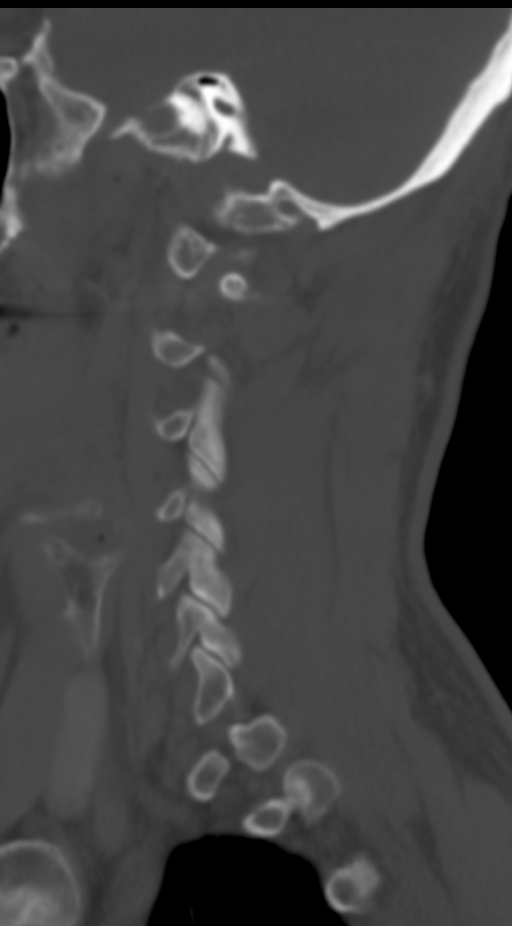

[10 of 33 positions shown; findings below may reference images not displayed]

Count of known CT and Cardiac Nuclear Medicine studies performed in the previous 
12 months = 0.
FINDINGS: -------------------------------------------------------------------------------- 
------ 
GENERAL: 
Minimal dextroconvex cervical thoracic scoliosis. 2 mm retrolisthesis C5 on C6. 
Vertebral body height preserved. No fracture. 2 mm sclerotic focus within the C7 
spinous process, indeterminate. No other sclerotic lesion. No lytic lesion. 
Prevertebral soft tissues are within normal limits. Bilateral palatine  
tonsillar calcifications. 
-------------------------------------------------------------------------------- 
------     
RELEVANT SEGMENTAL (levels with severe stenosis or significant findings): 
C4-C5: Stable appearing mild canal stenosis from generalized annular bulge. Mild 
ventral cord deformity. Foramina patent. 
C5-C6: Mild loss of disc height. Disc osteophyte complex is eccentric to the 
right with mild cord deformity along the right ventral cord and mild central 
canal narrowing. There is right lateral recess narrowing. Mild bilateral 
foraminal narrowing. Stable. 
C6-C7: Mild to moderate loss of disc height. Generalized disc osteophyte complex 
is somewhat eccentric to the left with moderate canal stenosis and ventral cord 
deformity. Bilateral uncinate spurring. Mild to moderate bilateral foraminal 
narrowing. Stable. 
Additional scattered discogenic/degenerative changes are noted 
-------------------------------------------------------------------------------- 
-----
IMPRESSION: Stable appearing cervical spondylosis, most significant from C4-C5 through 
C6-C7. 
RADIATION DOSE REDUCTION: All CT scans are performed using radiation dose 
reduction techniques, when applicable.  Technical factors are evaluated and 
adjusted to ensure appropriate moderation of exposure.  Automated dose 
management technology is applied to adjust the radiation doses to minimize 
exposure while achieving diagnostic quality images.
